# Patient Record
Sex: Male | Born: 1990 | Hispanic: Yes | Marital: Single | Smoking: Never smoker
Health system: Southern US, Community
[De-identification: ages and names within clinical notes are randomized; demographics above are authoritative.]

---

## 2018-12-07 ENCOUNTER — Encounter (HOSPITAL_COMMUNITY): Payer: Self-pay

## 2018-12-07 ENCOUNTER — Other Ambulatory Visit: Payer: Self-pay

## 2018-12-07 ENCOUNTER — Ambulatory Visit (HOSPITAL_COMMUNITY)
Admission: EM | Admit: 2018-12-07 | Discharge: 2018-12-07 | Disposition: A | Discharge status: Home / Self Care | Payer: 59 | Attending: Physician Assistant | Admitting: Physician Assistant

## 2018-12-07 ENCOUNTER — Ambulatory Visit (INDEPENDENT_AMBULATORY_CARE_PROVIDER_SITE_OTHER): Payer: 59

## 2018-12-07 DIAGNOSIS — M25561 Pain in right knee: Secondary | ICD-10-CM

## 2018-12-07 DIAGNOSIS — S8991XA Unspecified injury of right lower leg, initial encounter: Secondary | ICD-10-CM | POA: Diagnosis not present

## 2018-12-07 DIAGNOSIS — W19XXXA Unspecified fall, initial encounter: Secondary | ICD-10-CM | POA: Diagnosis not present

## 2018-12-07 MED ORDER — MELOXICAM 7.5 MG PO TABS: 7.5000 mg | ORAL_TABLET | Freq: Every day | ORAL | 0 refills | Status: AC

## 2018-12-07 NOTE — Discharge Instructions (Signed)
X-ray negative for fracture or dislocation. Start Mobic. Do not take ibuprofen (motrin/advil)/ naproxen (aleve) while on mobic.  Ice compress, elevation, knee sleeve during activity.  Follow-up with PCP for further evaluation if symptoms not improving.

## 2018-12-07 NOTE — ED Triage Notes (Signed)
Pt feel on his right knee this weekend. Pt states he landed on his right knee.

## 2018-12-07 NOTE — ED Provider Notes (Signed)
[facility]    CSN: [id number] Arrival date & time: 12/07/18  1157     History   Chief Complaint Chief Complaint  Patient presents with  . Knee Pain    HPI Albert Townsend is a 28 y.o. male.   28 year old male comes in for 2 day history of right knee pain. Patient states he tripped and fell, landing on the right knee. He states may have twisted his left ankle during the fall, but has not had any pain to the left ankle. Denies any twisting motion to the right knee. He has abrasions to the anterior knee. Has still been able to bear weight. States no pain at rest, pain mostly with flexion, to the anterior knee. Has generalized swelling with mild improvement after elevation. Denies numbness/tingling. Has not taken anything for the symptoms.      History reviewed. No pertinent past medical history.  There are no active problems to display for this patient.   History reviewed. No pertinent surgical history.     Home Medications    Prior to Admission medications   Medication Sig Start Date End Date Taking? Authorizing Provider  meloxicam (MOBIC) 7.5 MG tablet Take 1 tablet (7.5 mg total) by mouth daily. 12/07/18   Belinda Fisher, PA-C    Family History Family History  Problem Relation Age of Onset  . Healthy Mother   . Healthy Father     Social History Social History   Tobacco Use  . Smoking status: Never Smoker  . Smokeless tobacco: Never Used  Substance Use Topics  . Alcohol use: Yes  . Drug use: Never     Allergies   Patient has no known allergies.   Review of Systems Review of Systems  Reason unable to perform ROS: See HPI as above.     Physical Exam Triage Vital Signs ED Triage Vitals  Enc Vitals Group     BP 12/07/18 0953 132/80     Pulse Rate 12/07/18 0953 66     Resp 12/07/18 0953 18     Temp 12/07/18 0953 97.8 F (36.6 C)     Temp Source 12/07/18 0953 Oral     SpO2 12/07/18 0953 99 %     Weight 12/07/18 0954 260 lb (117.9 kg)   Height --      Head Circumference --      Peak Flow --      Pain Score 12/07/18 0954 8     Pain Loc --      Pain Edu? --      Excl. in GC? --    No data found.  Updated Vital Signs BP 132/80 (BP Location: Right Arm)   Pulse 66   Temp 97.8 F (36.6 C) (Oral)   Resp 18   Wt 260 lb (117.9 kg)   SpO2 99%   Physical Exam Constitutional:      General: He is not in acute distress.    Appearance: He is well-developed. He is not diaphoretic.  HENT:     Head: Normocephalic and atraumatic.  Eyes:     Conjunctiva/sclera: Conjunctivae normal.     Pupils: Pupils are equal, round, and reactive to light.  Musculoskeletal:     Comments: Well healing abrasions to the anterior right knee. No surrounding erythema, warmth. Mild swelling to the knee. Tenderness to palpation of anterior knee, inferior to the patellar, and to the tibial tuberosity. Full ROM of the knee. Strength normal and equal bilaterally. Sensation  intact and equal bilaterally.   Neurological:     Mental Status: He is alert and oriented to person, place, and time.    UC Treatments / Results  Labs (all labs ordered are listed, but only abnormal results are displayed) Labs Reviewed - No data to display  EKG None  Radiology Dg Knee Complete 4 Views Right  Result Date: 12/07/2018 CLINICAL DATA:  Pain post fall. EXAM: RIGHT KNEE - COMPLETE 4+ VIEW COMPARISON:  None. FINDINGS: No evidence of fracture, dislocation, or joint effusion. No evidence of arthropathy or other focal bone abnormality. Soft tissues are unremarkable. IMPRESSION: Negative. Electronically Signed   By: Ted Mcalpine M.D.   On: 12/07/2018 11:01    Procedures Procedures (including critical care time)  Medications Ordered in UC Medications - No data to display  Initial Impression / Assessment and Plan / UC Course  I have reviewed the triage vital signs and the nursing notes.  Pertinent labs & imaging results that were available during my care of the  patient were reviewed by me and considered in my medical decision making (see chart for details).     Discussed given history and exam, low suspicion for fractures. Discussed xray vs symptomatic treatment with monitoring. Patient would like to proceed with xray.  Xray negative for fracture or dislocation. Start NSAIDs, ice compress, elevation, knee sleeve during activity. Return precautions given.   Final Clinical Impressions(s) / UC Diagnoses   Final diagnoses:  Acute pain of right knee   ED Prescriptions    Medication Sig Dispense Auth. Provider   meloxicam (MOBIC) 7.5 MG tablet Take 1 tablet (7.5 mg total) by mouth daily. 15 tablet [employer]reasa [employer], [place] 12/07/18 1154

## 2019-11-18 ENCOUNTER — Encounter (HOSPITAL_COMMUNITY): Payer: 59

## 2020-01-20 IMAGING — DX RIGHT KNEE - COMPLETE 4+ VIEW
5 series · 5 of 5 positions shown · non-contrast
Comparison: None.

CLINICAL DATA: Pain post fall.

EXAM:
RIGHT KNEE - COMPLETE 4+ VIEW

[knee ap]
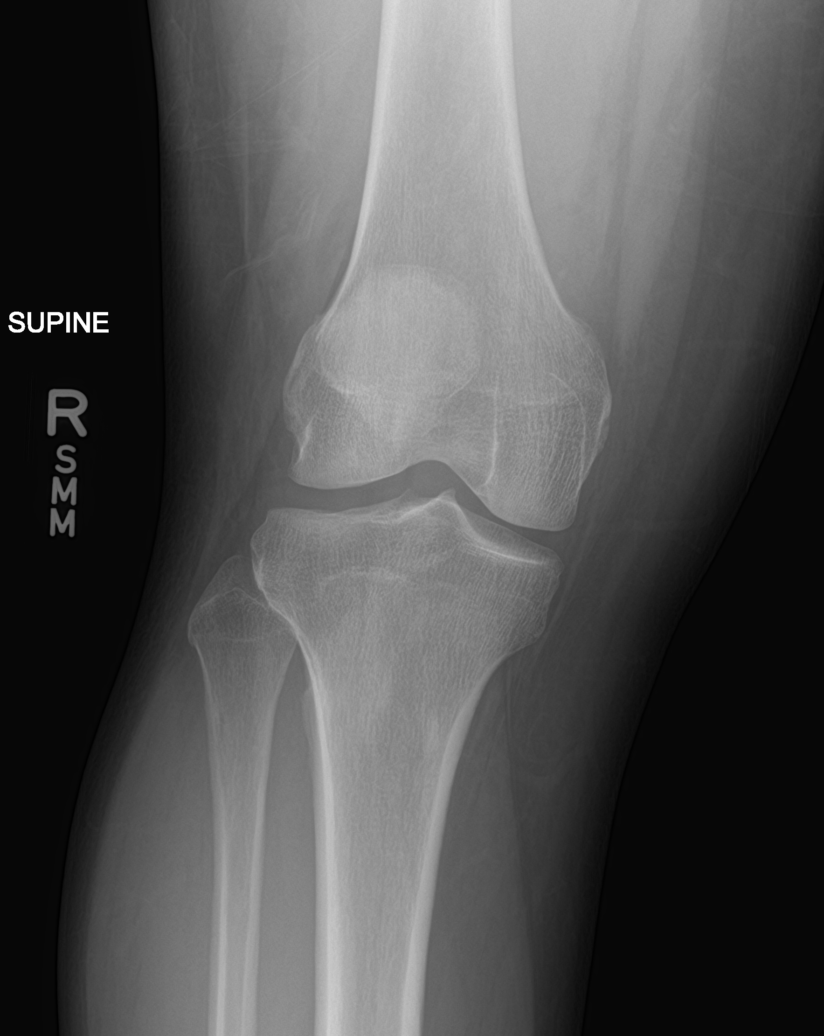

[knee obl (1 of 2)]
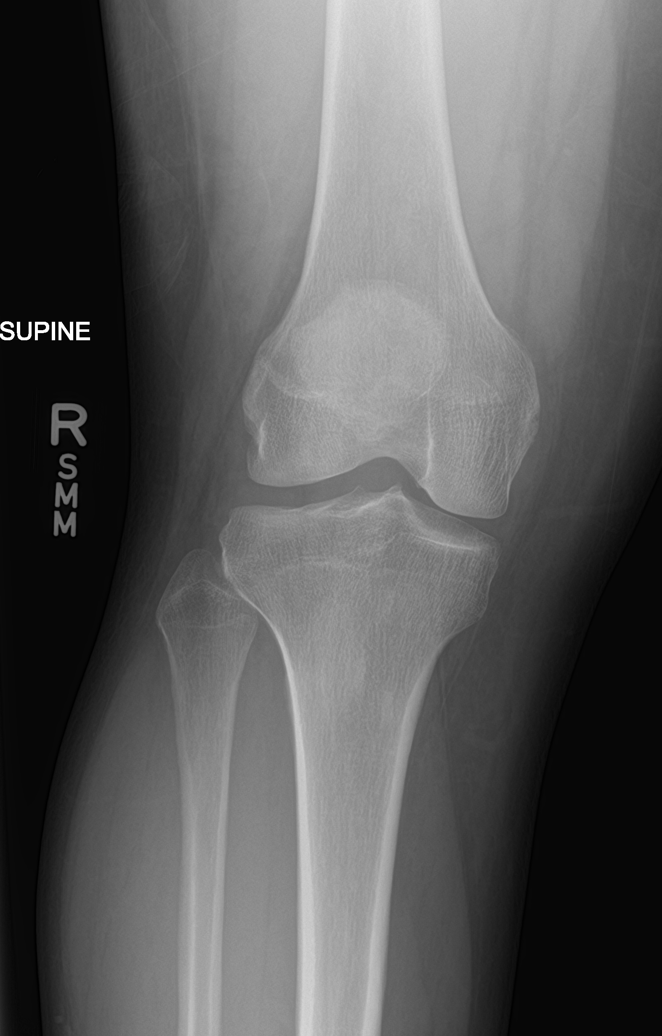

[knee obl (2 of 2)]
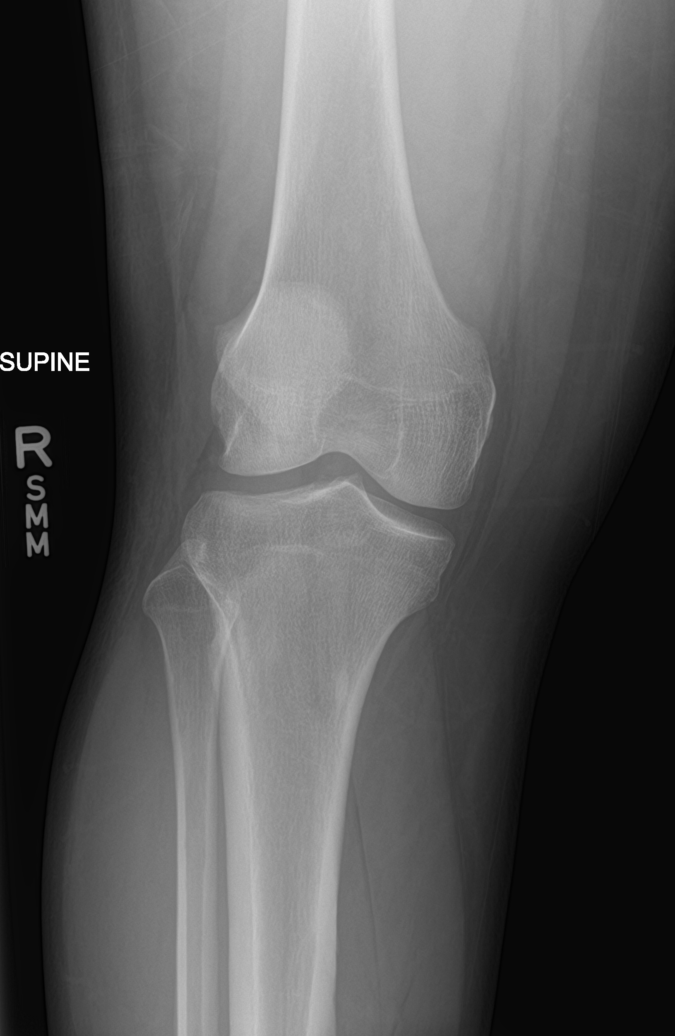

[knee lat (1 of 2)]
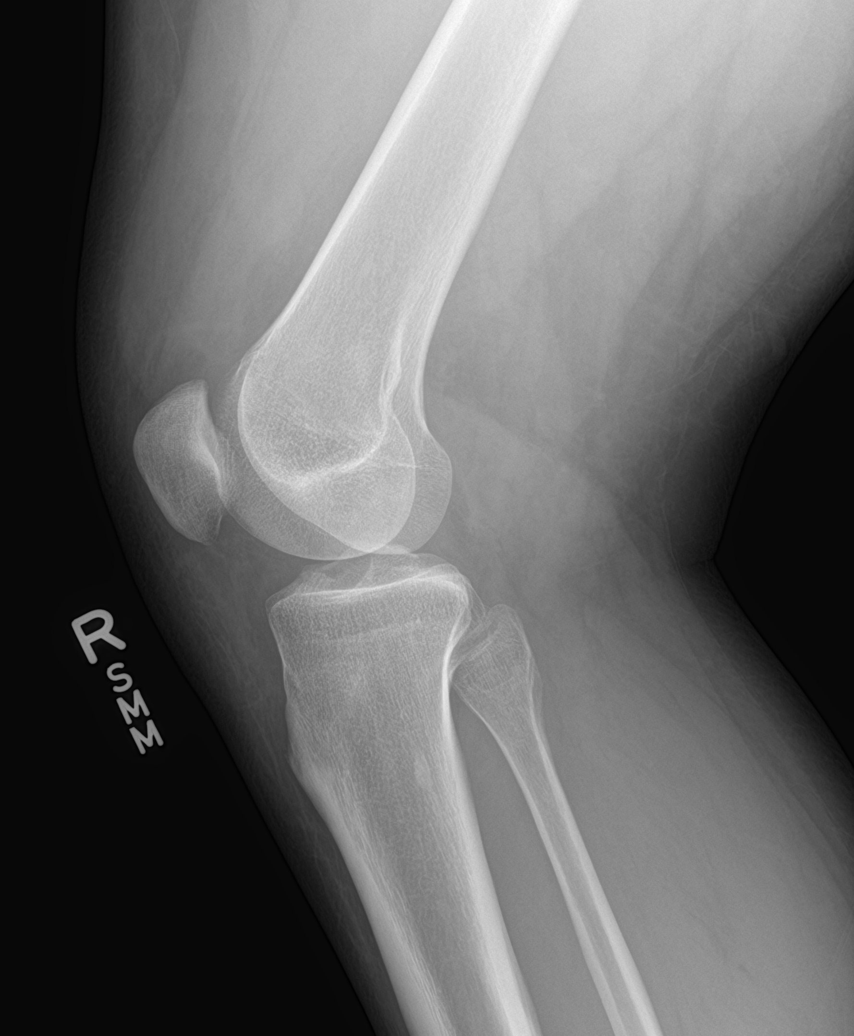

[knee lat (2 of 2)]
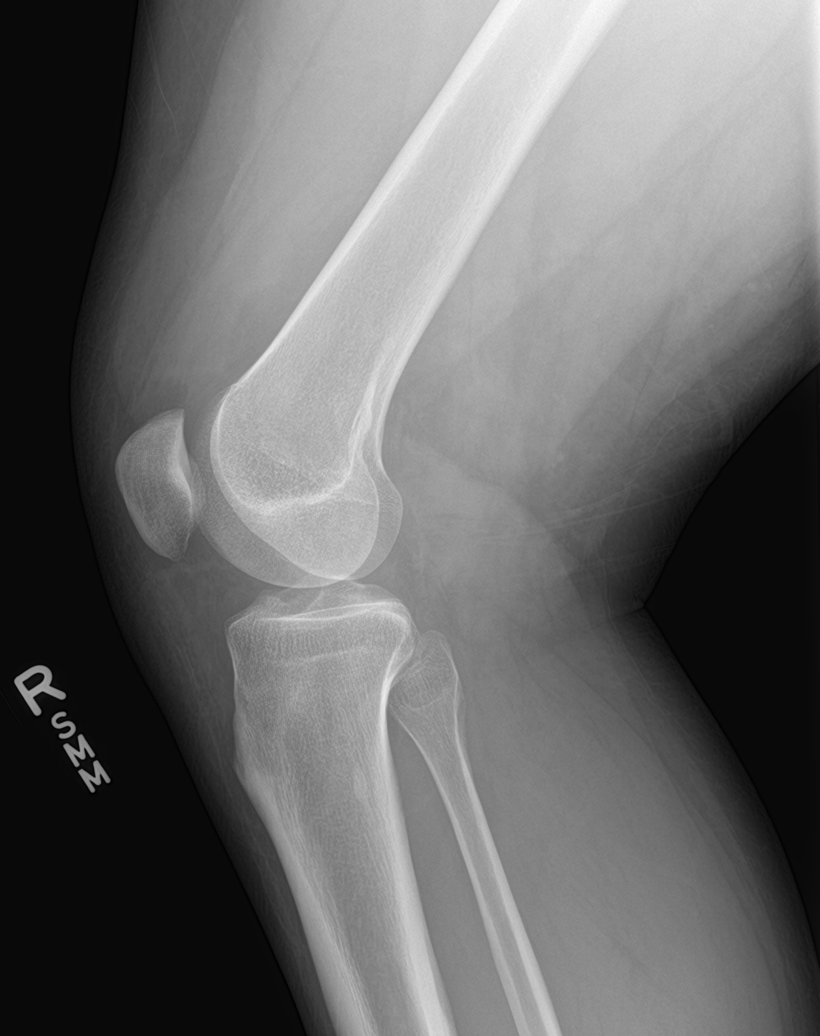

[5 of 5 positions shown; findings below may reference images not displayed]

FINDINGS: No evidence of fracture, dislocation, or joint effusion. No evidence
of arthropathy or other focal bone abnormality. Soft tissues are
unremarkable.
IMPRESSION: Negative.
